# Patient Record
Sex: Male | Born: 1980 | Race: Asian | Hispanic: No | Marital: Married | Smoking: Never smoker
Health system: Southern US, Community
[De-identification: ages and names within clinical notes are randomized; demographics above are authoritative.]

---

## 2012-12-14 ENCOUNTER — Ambulatory Visit: Payer: No Typology Code available for payment source | Attending: Family Medicine | Admitting: Family Medicine

## 2012-12-14 VITALS — BP 128/90 | HR 60 | Temp 98.1°F | Resp 15 | Wt 151.0 lb

## 2012-12-14 DIAGNOSIS — R109 Unspecified abdominal pain: Secondary | ICD-10-CM

## 2012-12-14 DIAGNOSIS — R1013 Epigastric pain: Secondary | ICD-10-CM | POA: Insufficient documentation

## 2012-12-14 LAB — COMPREHENSIVE METABOLIC PANEL
AST: 28 U/L (ref 0–37)
Albumin: 4.8 g/dL (ref 3.5–5.2)
Alkaline Phosphatase: 73 U/L (ref 39–117)
BUN: 16 mg/dL (ref 6–23)
Glucose, Bld: 99 mg/dL (ref 70–99)
Potassium: 3.8 mEq/L (ref 3.5–5.3)
Sodium: 137 mEq/L (ref 135–145)
Total Bilirubin: 0.5 mg/dL (ref 0.3–1.2)
Total Protein: 7.8 g/dL (ref 6.0–8.3)

## 2012-12-14 LAB — CBC WITH DIFFERENTIAL/PLATELET
Basophils Relative: 0 % (ref 0–1)
Eosinophils Absolute: 0.7 10*3/uL (ref 0.0–0.7)
Eosinophils Relative: 11 % — ABNORMAL HIGH (ref 0–5)
Hemoglobin: 14.2 g/dL (ref 13.0–17.0)
Lymphs Abs: 1.9 10*3/uL (ref 0.7–4.0)
MCH: 27.8 pg (ref 26.0–34.0)
MCHC: 34.1 g/dL (ref 30.0–36.0)
MCV: 81.6 fL (ref 78.0–100.0)
Monocytes Relative: 10 % (ref 3–12)
Neutrophils Relative %: 47 % (ref 43–77)
RBC: 5.1 MIL/uL (ref 4.22–5.81)

## 2012-12-14 NOTE — Progress Notes (Signed)
Patient here for abd pain- left side States he has had this pain for over a year

## 2012-12-14 NOTE — Progress Notes (Signed)
Subjective:     Patient ID: Bence Trapp, male   DOB: [birth year], 32 y.o.   MRN: [id number]  HPI Pt here with left sided and epigastric epigastric pain for 8 mos. Pain is worse when he eats spicy foods. It waxes and wanes. 8/10 severity.    Review of Systems no nausea/vomiting/diarrhea/constipation/shob     Objective:   Physical Exam  Nursing note and vitals reviewed. Constitutional: He appears well-developed and well-nourished.  Cardiovascular: Normal rate and regular rhythm.   Pulmonary/Chest: Effort normal and breath sounds normal.  Abdominal: Soft. Bowel sounds are normal. He exhibits no distension. There is no rebound and no guarding.   ttp LUQ and epigastrum      Assessment:     Abdominal  pain, other specified site - Plan: Comprehensive metabolic panel, Urinalysis Dipstick, CBC with Differential, TSH, Lipase, Mononucleosis Test, Qual W/ Reflex, H. pylori Screen       Plan:     Check basic labs as above If nondiagnostic will get CT and refer for egd F/u 2 weeks rtc earlier if needed, ed if acutely worse

## 2012-12-14 NOTE — Patient Instructions (Signed)

## 2012-12-22 ENCOUNTER — Telehealth: Payer: Self-pay | Admitting: *Deleted

## 2012-12-22 ENCOUNTER — Telehealth: Payer: Self-pay

## 2012-12-22 NOTE — Progress Notes (Signed)
Quick Note:  Please inform patient that mono test came back negative.  Rodney Langton, MD, CDE, FAAFP [facility] [place], [place]   ______

## 2012-12-22 NOTE — Telephone Encounter (Signed)
12/22/12 Patient unavailable message left that mono test was negative.P.Lacrecia Delval,RNBSN

## 2012-12-22 NOTE — Telephone Encounter (Signed)
Patient not available left message to return call

## 2014-03-14 ENCOUNTER — Emergency Department (HOSPITAL_COMMUNITY)
Admission: EM | Admit: 2014-03-14 | Discharge: 2014-03-14 | Disposition: A | Discharge status: Home / Self Care | Payer: Self-pay | Attending: Emergency Medicine | Admitting: Emergency Medicine

## 2014-03-14 ENCOUNTER — Emergency Department (HOSPITAL_COMMUNITY): Payer: Self-pay

## 2014-03-14 ENCOUNTER — Encounter (HOSPITAL_COMMUNITY): Payer: Self-pay | Admitting: Emergency Medicine

## 2014-03-14 DIAGNOSIS — S59909A Unspecified injury of unspecified elbow, initial encounter: Secondary | ICD-10-CM | POA: Insufficient documentation

## 2014-03-14 DIAGNOSIS — Y92410 Unspecified street and highway as the place of occurrence of the external cause: Secondary | ICD-10-CM | POA: Insufficient documentation

## 2014-03-14 DIAGNOSIS — M25532 Pain in left wrist: Secondary | ICD-10-CM

## 2014-03-14 DIAGNOSIS — S59919A Unspecified injury of unspecified forearm, initial encounter: Principal | ICD-10-CM

## 2014-03-14 DIAGNOSIS — S6990XA Unspecified injury of unspecified wrist, hand and finger(s), initial encounter: Secondary | ICD-10-CM | POA: Insufficient documentation

## 2014-03-14 DIAGNOSIS — Y9366 Activity, soccer: Secondary | ICD-10-CM | POA: Insufficient documentation

## 2014-03-14 DIAGNOSIS — W219XXA Striking against or struck by unspecified sports equipment, initial encounter: Secondary | ICD-10-CM | POA: Insufficient documentation

## 2014-03-14 MED ORDER — NAPROXEN 500 MG PO TABS: 500.0000 mg | ORAL_TABLET | Freq: Two times a day (BID) | ORAL | Status: AC

## 2014-03-14 MED ORDER — OXYCODONE-ACETAMINOPHEN 5-325 MG PO TABS: 1.0000 | ORAL_TABLET | ORAL | Status: AC | PRN

## 2014-03-14 NOTE — ED Provider Notes (Signed)
CSN: [id number]     Arrival date & time 03/14/14  1921 History   First MD Initiated Contact with Patient 03/14/14 2139     Chief Complaint  Patient presents with  . Hand Pain     (Consider location/radiation/quality/duration/timing/severity/associated sxs/prior Treatment) HPI Chase Willis is a 33 y.o. male who presents for evaluation for left hand pain. History is obtained via interpreter. Patient was playing soccer this afternoon when the ball hit his left hand and bent it backwards. He went to the store and purchased a brace for his wrist as well as a cream that "gets really hot and then cold", but has not helped. He denies taking any other pain medication. He says the pain is mostly on top of his left wrist. No fevers, nausea vomiting, chest pain or difficulty breathing  History reviewed. No pertinent past medical history. No past surgical history on file. No family history on file. History  Substance Use Topics  . Smoking status: Not on file  . Smokeless tobacco: Not on file  . Alcohol Use: Not on file    Review of Systems  Constitutional: Negative for fever.  Respiratory: Negative for shortness of breath.   Cardiovascular: Negative for chest pain.  Musculoskeletal: Positive for arthralgias.  Skin: Negative for rash.      Allergies  Review of patient's allergies indicates no known allergies.  Home Medications   Prior to Admission medications   Medication Sig Start Date End Date Taking? Authorizing Provider  naproxen (NAPROSYN) 500 MG tablet Take 1 tablet (500 mg total) by mouth 2 (two) times daily. 03/14/14   Sharlene Motts, PA-C  oxyCODONE-acetaminophen (PERCOCET) 5-325 MG per tablet Take 1 tablet by mouth every 4 (four) hours as needed. 03/14/14   Earle Gell Girtha Kilgore, PA-C   BP 120/80  Pulse 80  Temp(Src) 98.9 F (37.2 C) (Oral)  Resp 16  Ht  (1.676 m)  Wt 150 lb (68.04 kg)  BMI 24.22 kg/m2  SpO2 98% Physical Exam  Nursing note and vitals  reviewed. Constitutional: He appears well-developed and well-nourished.  HENT:  Head: Normocephalic and atraumatic.  Eyes: Conjunctivae and EOM are normal. Right eye exhibits no discharge. Left eye exhibits no discharge. No scleral icterus.  Cardiovascular:  Peripheral pulses intact at injured extremity.  Pulmonary/Chest: Effort normal. No respiratory distress.  Musculoskeletal:  L wrist Able to flex and extend against resistance. No appreciable edema or erythema present. No obvious deformities noted  Neurological:  No Numbness distal to injury. Neurovascularly intact. Able to perform cardinal hand movements. Cap refill less than 2 seconds. Distal pulses intact. Grip strength intact but decreased due to discomfort. No snuffbox tenderness  Skin: Skin is warm and dry. No rash noted.    ED Course  Procedures (including critical care time) Labs Review Labs Reviewed - No data to display  Imaging Review Dg Hand 2 View Left  03/14/2014   CLINICAL DATA:  Radial wrist pain, soccer injury  EXAM: LEFT HAND - 2 VIEW  COMPARISON:  None.  FINDINGS: There is no evidence of fracture or dislocation. There is no evidence of arthropathy or other focal bone abnormality. Soft tissues are unremarkable.  IMPRESSION: Negative.   Electronically Signed   By: Malachy Moan M.D.   On: 03/14/2014 20:56     EKG Interpretation None      MDM  Vitals stable - WNL -afebrile Pt resting comfortably in ED. PE not concerning for other acute, emergent pathology. Injury sustained consistent with a sprain.  Patient has a splint with him he prefers to use. Imaging shows no acute fractures or dislocations Will DC with naproxen and Percocet for pain Discussed f/u with PCP and return precautions, pt very amenable to plan.   Final diagnoses:  Wrist pain, acute, left  Prior to patient discharge, I discussed and reviewed this case with Dr.Harrison         Sharlene Motts, PA-C 03/14/14 2333

## 2014-03-14 NOTE — ED Notes (Signed)
Pt c/o left hand pain for playing soccer today. Pt's hand was hit by the soccer ball in attempt to stop the ball. CMS intact

## 2014-03-16 NOTE — ED Provider Notes (Signed)
Medical screening examination/treatment/procedure(s) were conducted as a shared visit with non-physician practitioner(s) and myself.  I personally evaluated the patient during the encounter.   EKG Interpretation None      I interviewed and examined the patient. Lungs are CTAB. Cardiac exam wnl. Abdomen soft.  Normal rom of wrist. 2+ pulses in UE's. Likely wrist sprain.   Purvis Sheffield, MD 03/16/14 1213

## 2014-12-23 ENCOUNTER — Encounter (HOSPITAL_COMMUNITY): Payer: Self-pay | Admitting: *Deleted

## 2014-12-23 DIAGNOSIS — R0602 Shortness of breath: Secondary | ICD-10-CM | POA: Insufficient documentation

## 2014-12-23 DIAGNOSIS — K828 Other specified diseases of gallbladder: Secondary | ICD-10-CM | POA: Insufficient documentation

## 2014-12-23 DIAGNOSIS — Z791 Long term (current) use of non-steroidal anti-inflammatories (NSAID): Secondary | ICD-10-CM | POA: Diagnosis not present

## 2014-12-23 DIAGNOSIS — R109 Unspecified abdominal pain: Secondary | ICD-10-CM | POA: Diagnosis present

## 2014-12-23 LAB — COMPREHENSIVE METABOLIC PANEL
ALBUMIN: 4.1 g/dL (ref 3.5–5.0)
ALK PHOS: 94 U/L (ref 38–126)
ALT: 37 U/L (ref 17–63)
ANION GAP: 8 (ref 5–15)
AST: 29 U/L (ref 15–41)
BUN: 18 mg/dL (ref 6–20)
CO2: 29 mmol/L (ref 22–32)
Calcium: 9.2 mg/dL (ref 8.9–10.3)
Chloride: 100 mmol/L — ABNORMAL LOW (ref 101–111)
Creatinine, Ser: 0.99 mg/dL (ref 0.61–1.24)
GFR calc Af Amer: >60 mL/min (ref >60)
GFR calc non Af Amer: >60 mL/min (ref >60)
GLUCOSE: 126 mg/dL — AB (ref 65–99)
POTASSIUM: 3.1 mmol/L — AB (ref 3.5–5.1)
Sodium: 137 mmol/L (ref 135–145)
Total Bilirubin: 0.4 mg/dL (ref 0.3–1.2)
Total Protein: 7.2 g/dL (ref 6.5–8.1)

## 2014-12-23 LAB — CBC WITH DIFFERENTIAL/PLATELET
Basophils Absolute: 0 10*3/uL (ref 0.0–0.1)
Basophils Relative: 0 % (ref 0–1)
Eosinophils Absolute: 0.6 10*3/uL (ref 0.0–0.7)
Eosinophils Relative: 8 % — ABNORMAL HIGH (ref 0–5)
HCT: 40.1 % (ref 39.0–52.0)
Hemoglobin: 13.6 g/dL (ref 13.0–17.0)
LYMPHS ABS: 3.2 10*3/uL (ref 0.7–4.0)
LYMPHS PCT: 42 % (ref 12–46)
MCH: 28.3 pg (ref 26.0–34.0)
MCHC: 33.9 g/dL (ref 30.0–36.0)
MCV: 83.4 fL (ref 78.0–100.0)
Monocytes Absolute: 0.7 10*3/uL (ref 0.1–1.0)
Monocytes Relative: 9 % (ref 3–12)
NEUTROS ABS: 3.1 10*3/uL (ref 1.7–7.7)
Neutrophils Relative %: 41 % — ABNORMAL LOW (ref 43–77)
PLATELETS: 234 10*3/uL (ref 150–400)
RBC: 4.81 MIL/uL (ref 4.22–5.81)
RDW: 13.2 % (ref 11.5–15.5)
WBC: 7.6 10*3/uL (ref 4.0–10.5)

## 2014-12-23 LAB — URINALYSIS, ROUTINE W REFLEX MICROSCOPIC
Bilirubin Urine: NEGATIVE
Glucose, UA: NEGATIVE mg/dL
Hgb urine dipstick: NEGATIVE
Ketones, ur: NEGATIVE mg/dL
LEUKOCYTES UA: NEGATIVE
NITRITE: NEGATIVE
PROTEIN: NEGATIVE mg/dL
Specific Gravity, Urine: 1.021 (ref 1.005–1.030)
Urobilinogen, UA: 0.2 mg/dL (ref 0.0–1.0)
pH: 7 (ref 5.0–8.0)

## 2014-12-23 LAB — LIPASE, BLOOD: Lipase: 20 U/L — ABNORMAL LOW (ref 22–51)

## 2014-12-23 NOTE — ED Notes (Addendum)
Pt c/o abdominal pain. Pt recently moved and changed doctors. Pt's brother states the pt has had abdominal pain for over a year. Pt requesting XR on abdomen. Pt denies n/v, reports some diarrhea

## 2014-12-24 ENCOUNTER — Emergency Department (HOSPITAL_COMMUNITY): Payer: BLUE CROSS/BLUE SHIELD

## 2014-12-24 ENCOUNTER — Emergency Department (HOSPITAL_COMMUNITY)
Admission: EM | Admit: 2014-12-24 | Discharge: 2014-12-24 | Disposition: A | Discharge status: Home / Self Care | Payer: BLUE CROSS/BLUE SHIELD | Attending: Emergency Medicine | Admitting: Emergency Medicine

## 2014-12-24 DIAGNOSIS — K828 Other specified diseases of gallbladder: Secondary | ICD-10-CM

## 2014-12-24 MED ORDER — HYDROCODONE-ACETAMINOPHEN 5-325 MG PO TABS: 1.0000 | ORAL_TABLET | ORAL | Status: AC | PRN

## 2014-12-24 MED ORDER — ONDANSETRON 8 MG PO TBDP: 8.0000 mg | ORAL_TABLET | Freq: Three times a day (TID) | ORAL | Status: AC | PRN

## 2014-12-24 NOTE — Discharge Instructions (Signed)
Biliary Colic  °Biliary colic is a steady or irregular pain in the upper abdomen. It is usually under the right side of the rib cage. It happens when gallstones interfere with the normal flow of bile from the gallbladder. Bile is a liquid that helps to digest fats. Bile is made in the liver and stored in the gallbladder. When you eat a meal, bile passes from the gallbladder through the cystic duct and the common bile duct into the small intestine. There, it mixes with partially digested food. If a gallstone blocks either of these ducts, the normal flow of bile is blocked. The muscle cells in the bile duct contract forcefully to try to move the stone. This causes the pain of biliary colic.  °SYMPTOMS  °· A person with biliary colic usually complains of pain in the upper abdomen. This pain can be: °¨ In the center of the upper abdomen just below the breastbone. °¨ In the upper-right part of the abdomen, near the gallbladder and liver. °¨ Spread back toward the right shoulder blade. °· Nausea and vomiting. °· The pain usually occurs after eating. °· Biliary colic is usually triggered by the digestive system's demand for bile. The demand for bile is high after fatty meals. Symptoms can also occur when a person who has been fasting suddenly eats a very large meal. Most episodes of biliary colic pass after 1 to 5 hours. After the most intense pain passes, your abdomen may continue to ache mildly for about 24 hours. °DIAGNOSIS  °After you describe your symptoms, your caregiver will perform a physical exam. He or she will pay attention to the upper right portion of your belly (abdomen). This is the area of your liver and gallbladder. An ultrasound will help your caregiver look for gallstones. Specialized scans of the gallbladder may also be done. Blood tests may be done, especially if you have fever or if your pain persists. °PREVENTION  °Biliary colic can be prevented by controlling the risk factors for gallstones. Some of  these risk factors, such as heredity, increasing age, and pregnancy are a normal part of life. Obesity and a high-fat diet are risk factors you can change through a healthy lifestyle. Women going through menopause who take hormone replacement therapy (estrogen) are also more likely to develop biliary colic. °TREATMENT  °· Pain medication may be prescribed. °· You may be encouraged to eat a fat-free diet. °· If the first episode of biliary colic is severe, or episodes of colic keep retuning, surgery to remove the gallbladder (cholecystectomy) is usually recommended. This procedure can be done through small incisions using an instrument called a laparoscope. The procedure often requires a brief stay in the hospital. Some people can leave the hospital the same day. It is the most widely used treatment in people troubled by painful gallstones. It is effective and safe, with no complications in more than 90% of cases. °· If surgery cannot be done, medication that dissolves gallstones may be used. This medication is expensive and can take months or years to work. Only small stones will dissolve. °· Rarely, medication to dissolve gallstones is combined with a procedure called shock-wave lithotripsy. This procedure uses carefully aimed shock waves to break up gallstones. In many people treated with this procedure, gallstones form again within a few years. °PROGNOSIS  °If gallstones block your cystic duct or common bile duct, you are at risk for repeated episodes of biliary colic. There is also a 25% chance that you will develop   a gallbladder infection(acute cholecystitis), or some other complication of gallstones within 10 to 20 years. If you have surgery, schedule it at a time that is convenient for you and at a time when you are not sick. °HOME CARE INSTRUCTIONS  °· Drink plenty of clear fluids. °· Avoid fatty, greasy or fried foods, or any foods that make your pain worse. °· Take medications as directed. °SEEK MEDICAL  CARE IF:  °· You develop a fever over 100.5° F (38.1° C). °· Your pain gets worse over time. °· You develop nausea that prevents you from eating and drinking. °· You develop vomiting. °SEEK IMMEDIATE MEDICAL CARE IF:  °· You have continuous or severe belly (abdominal) pain which is not relieved with medications. °· You develop nausea and vomiting which is not relieved with medications. °· You have symptoms of biliary colic and you suddenly develop a fever and shaking chills. This may signal cholecystitis. Call your caregiver immediately. °· You develop a yellow color to your skin or the white part of your eyes (jaundice). °Document Released: 11/18/2005 Document Revised: 09/09/2011 Document Reviewed: 01/28/2008 °ExitCare® Patient Information ©2015 [employer]. This information is not intended to replace advice given to you by your health care provider. Make sure you discuss any questions you have with your health care provider. ° °

## 2014-12-24 NOTE — ED Provider Notes (Signed)
CSN: [id number]     Arrival date & time 12/23/14  2222 History  This chart was scribed for Azalia Bilis, MD by Merlene Laughter, ED Scribe. This patient was seen in room B15C/B15C and the patient's care was started at 2:40 AM.    Chief Complaint  Patient presents with  . Abdominal Pain     The history is provided by the patient. No language interpreter was used.    HPI Comments: Chase Willis is a 34 y.o. male who presents to the Emergency Department complaining of sudden onset, severe abdominal pain, each episode lasting 5 minutes that began 2 days ago with associated onset of shortness of breath due to increased pain.  Pain is exacerbated after eating a meal.  Patient complains of associated episode of diarrhea that occured 2 days ago that has since resolved. Patient reports history of gastritis 1 year ago. He denies associated nausea,vomiting, fever, hematochezia, melena, and dysuria.         History reviewed. No pertinent past medical history. History reviewed. No pertinent past surgical history. History reviewed. No pertinent family history. History  Substance Use Topics  . Smoking status: Never Smoker   . Smokeless tobacco: Never Used  . Alcohol Use: Yes    Review of Systems  A complete 10 system review of systems was obtained and all systems are negative except as noted in the HPI and PMH.    Allergies  Review of patient's allergies indicates no known allergies.  Home Medications   Prior to Admission medications   Medication Sig Start Date End Date Taking? Authorizing Provider  naproxen (NAPROSYN) 500 MG tablet Take 1 tablet (500 mg total) by mouth 2 (two) times daily. 03/14/14   Joycie Peek, PA-C  oxyCODONE-acetaminophen (PERCOCET) 5-325 MG per tablet Take 1 tablet by mouth every 4 (four) hours as needed. 03/14/14   Joycie Peek, PA-C   Triage Vitals: BP 111/64 mmHg  Pulse 59  Temp(Src) 98 F (36.7 C) (Oral)  Resp 16  SpO2 100% Physical Exam  Constitutional: He  is oriented to person, place, and time. He appears well-developed and well-nourished.  HENT:  Head: Normocephalic and atraumatic.  Eyes: EOM are normal.  Neck: Normal range of motion.  Cardiovascular: Normal rate, regular rhythm, normal heart sounds and intact distal pulses.   Pulmonary/Chest: Effort normal and breath sounds normal. No respiratory distress.  Abdominal: Soft. He exhibits no distension. There is tenderness.  Mild epigastric tenderness  Musculoskeletal: Normal range of motion.  Neurological: He is alert and oriented to person, place, and time.  Skin: Skin is warm and dry.  Psychiatric: He has a normal mood and affect. Judgment normal.  Nursing note and vitals reviewed.   ED Course  Procedures  DIAGNOSTIC STUDIES: Oxygen Saturation is 100% on room air, normal by my interpretation.    COORDINATION OF CARE: 2:49 AM- Discussed plans to order diagnostic ultrasound, lab work and urnialysis.  Pt advised of plan for treatment and pt agrees.  Labs Review Labs Reviewed  CBC WITH DIFFERENTIAL/PLATELET - Abnormal; Notable for the following:    Neutrophils Relative % 41 (*)    Eosinophils Relative 8 (*)    All other components within normal limits  COMPREHENSIVE METABOLIC PANEL - Abnormal; Notable for the following:    Potassium 3.1 (*)    Chloride 100 (*)    Glucose, Bld 126 (*)    All other components within normal limits  LIPASE, BLOOD - Abnormal; Notable for the following:    Lipase  20 (*)    All other components within normal limits  URINALYSIS, ROUTINE W REFLEX MICROSCOPIC (NOT AT [facility])    Imaging Review US Abdomen Complete  12/24/2014   CLINICAL DATA:  Generalized abdominal pain for greater than 1 year.  EXAM: ULTRASOUND ABDOMEN COMPLETE  COMPARISON:  None.  FINDINGS: Gallbladder: Small amount of echogenic apparent sludge within the gallbladder without gallbladder distension, wall thickening or pericholecystic cleared. No sonographic Murphy's sign elicited.  Common  bile duct: Diameter: 3 mm  Liver: Mildly echogenic liver without intrahepatic biliary dilatation. Hepatopetal portal vein.  IVC: No abnormality visualized.  Pancreas: Visualized portions are normal though, predominately obscured by bowel gas.  Spleen: Size and appearance within normal limits.  Right Kidney: Length: 10 cm. Echogenicity within normal limits. No mass or hydronephrosis visualized.  Left Kidney: Length: 10.9 cm. Echogenicity within normal limits. No mass or hydronephrosis visualized.  Abdominal aorta: No aneurysm visualized.  Other findings: None.  IMPRESSION: Small amount of suspected gallbladder sludge without sonographic findings of acute cholecystitis.  Mild hepatic steatosis.   Electronically Signed   By: Awilda Metro M.D.   On: 12/24/2014 04:41  I personally reviewed the imaging tests through PACS system I reviewed available ER/hospitalization records through the EMR    EKG Interpretation None      MDM   Final diagnoses:  Gallbladder sludge    Pt with symptoms consistent with biliary colic.  Ultrasound demonstrates gallbladder sludge.  Outpatient general surgery follow-up.  No discomfort or pain at this time.  No signs of cholecystitis.  LFTs and lipase are normal.  Patient understands to return to the ER for new or worsening symptoms   I personally performed the services described in this documentation, which was scribed in my presence. The recorded information has been reviewed and is accurate.       Azalia Bilis, MD 12/24/14 [phone]

## 2014-12-24 NOTE — ED Notes (Signed)
Pt speaks Karen 

## 2015-01-11 ENCOUNTER — Other Ambulatory Visit: Payer: Self-pay | Admitting: General Surgery

## 2015-01-11 ENCOUNTER — Ambulatory Visit: Payer: Self-pay | Admitting: General Surgery

## 2015-01-11 DIAGNOSIS — R1011 Right upper quadrant pain: Secondary | ICD-10-CM

## 2015-01-25 ENCOUNTER — Ambulatory Visit (HOSPITAL_COMMUNITY): Admission: RE | Admit: 2015-01-25 | Payer: BLUE CROSS/BLUE SHIELD | Source: Ambulatory Visit

## 2015-04-10 ENCOUNTER — Ambulatory Visit (HOSPITAL_COMMUNITY)
Admission: RE | Admit: 2015-04-10 | Discharge: 2015-04-10 | Disposition: A | Discharge status: Home / Self Care | Payer: BLUE CROSS/BLUE SHIELD | Source: Ambulatory Visit | Attending: General Surgery | Admitting: General Surgery

## 2015-04-10 DIAGNOSIS — R1011 Right upper quadrant pain: Secondary | ICD-10-CM

## 2015-04-10 DIAGNOSIS — R109 Unspecified abdominal pain: Secondary | ICD-10-CM | POA: Diagnosis present

## 2015-04-10 MED ORDER — TECHNETIUM TC 99M MEBROFENIN IV KIT
5.4000 | PACK | Freq: Once | INTRAVENOUS | Status: DC | PRN
  Administered 2015-04-10: 5 via INTRAVENOUS
  Filled 2015-04-10: qty 6

## 2015-04-10 MED ORDER — SINCALIDE 5 MCG IJ SOLR
0.0200 ug/kg | Freq: Once | INTRAMUSCULAR | Status: AC
  Administered 2015-04-10: 1.4 ug via INTRAVENOUS

## 2016-05-25 IMAGING — US US ABDOMEN COMPLETE
1 series · 14 of 25 positions shown · non-contrast
Comparison: None.

CLINICAL DATA: Generalized abdominal pain for greater than 1 year.

EXAM:
ULTRASOUND ABDOMEN COMPLETE

[Series 1: us abdomen complete · 0.24mm/px · 14 of 82 slices shown]
[im 1/82]
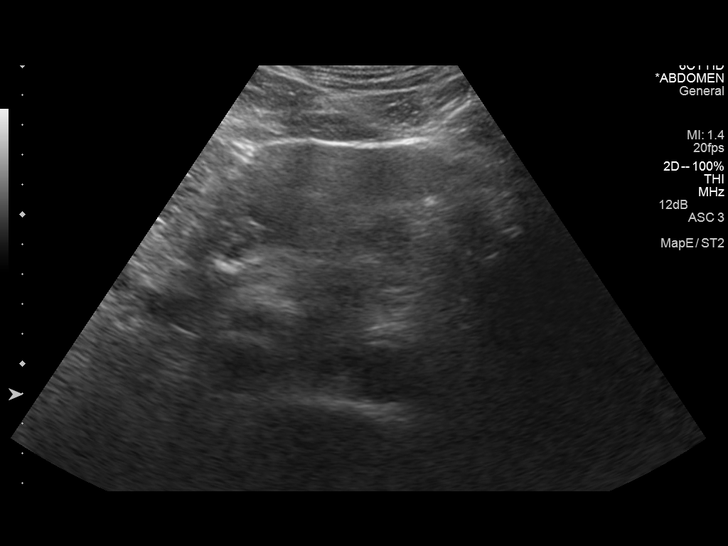
[im 7/82]
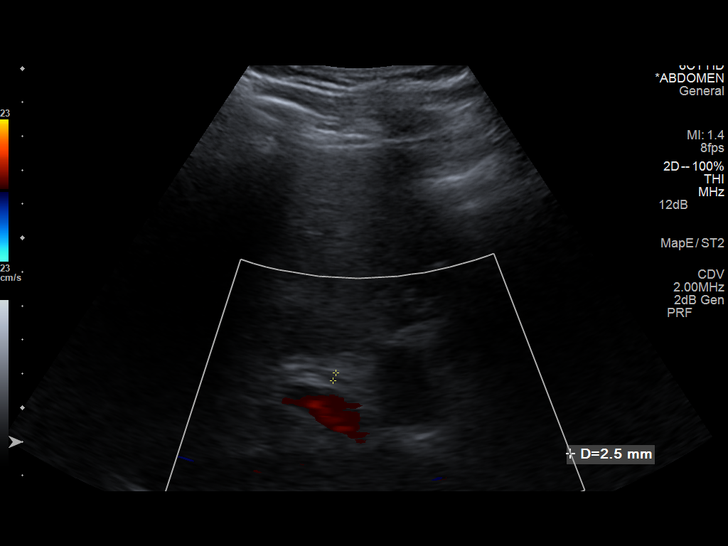
[im 14/82]
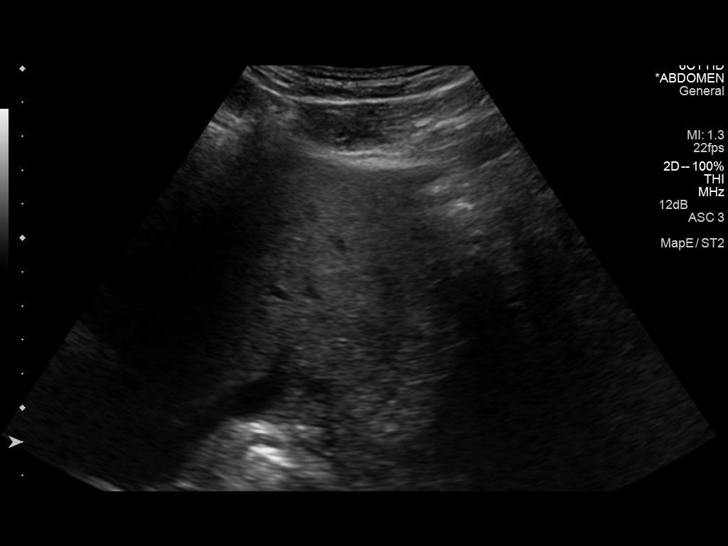
[im 21/82]
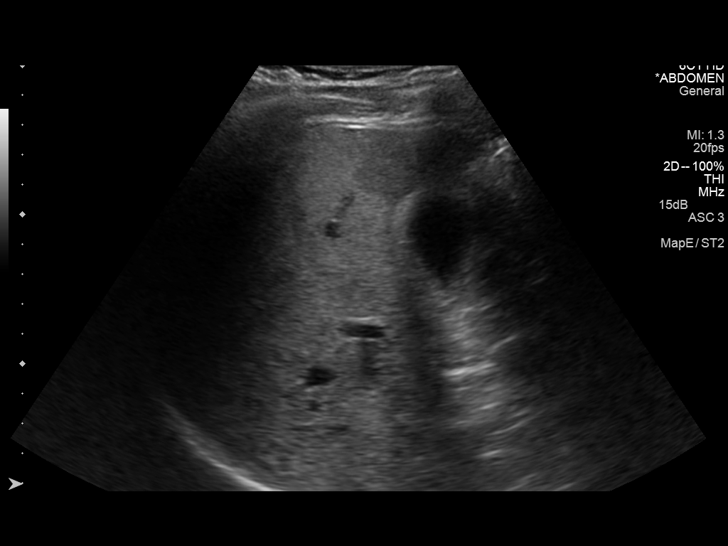
[im 28/82]
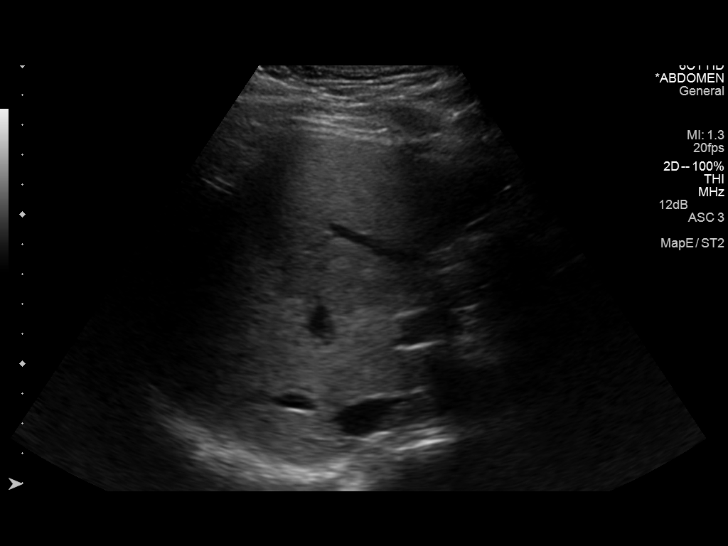
[im 31/82]
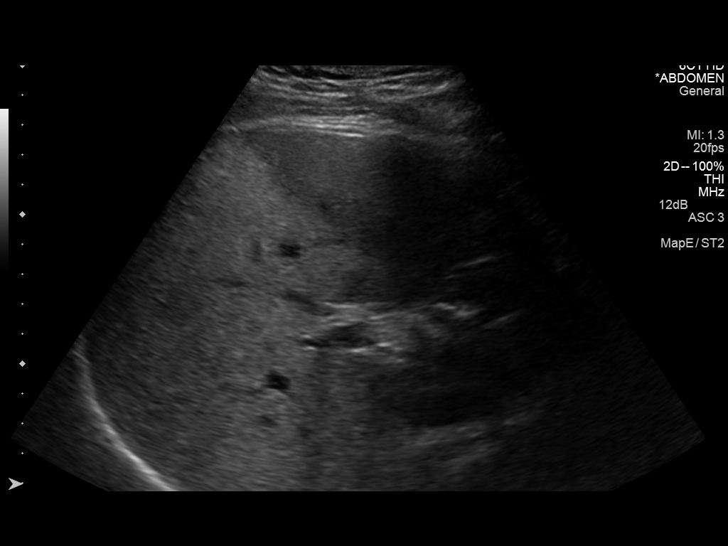
[im 38/82]
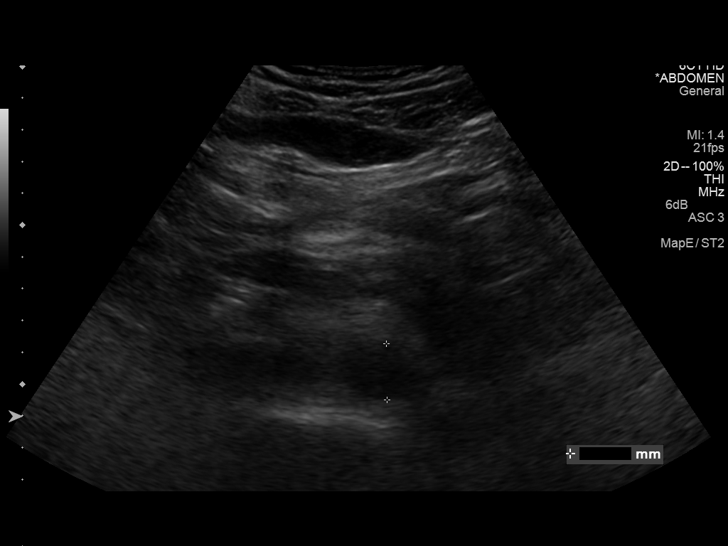
[im 44/82]
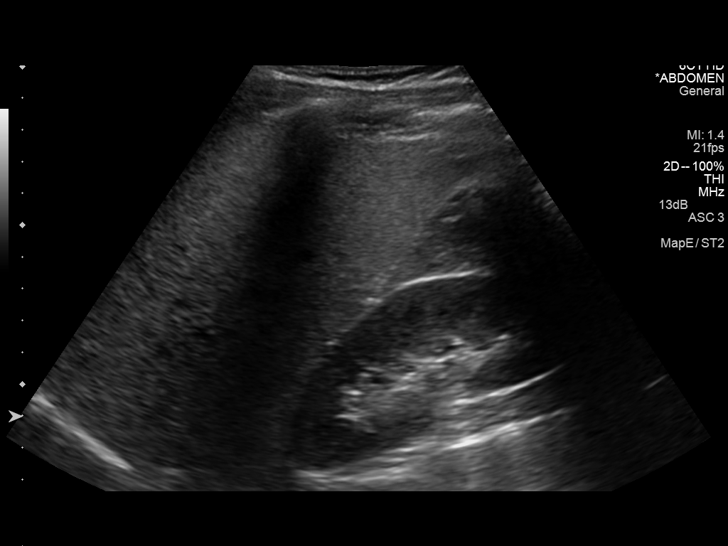
[im 51/82]
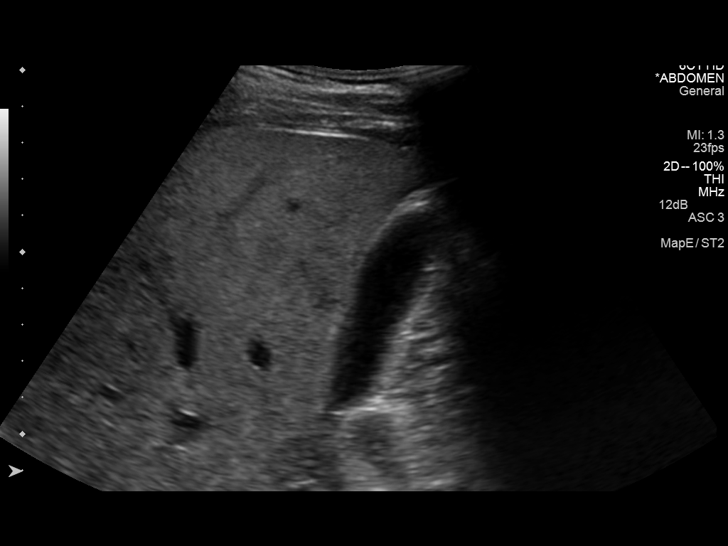
[im 55/82]
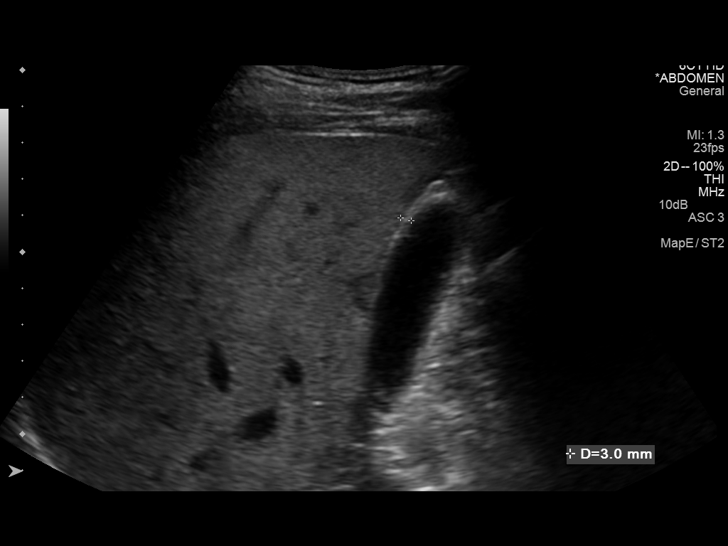
[im 61/82]
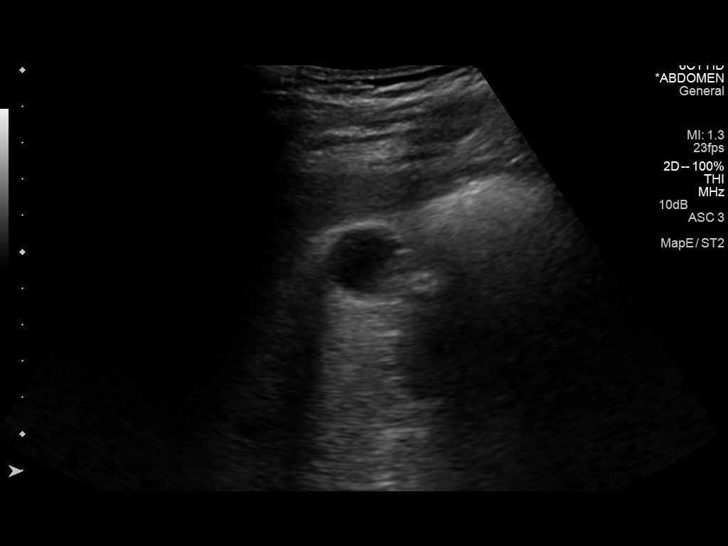
[im 68/82]
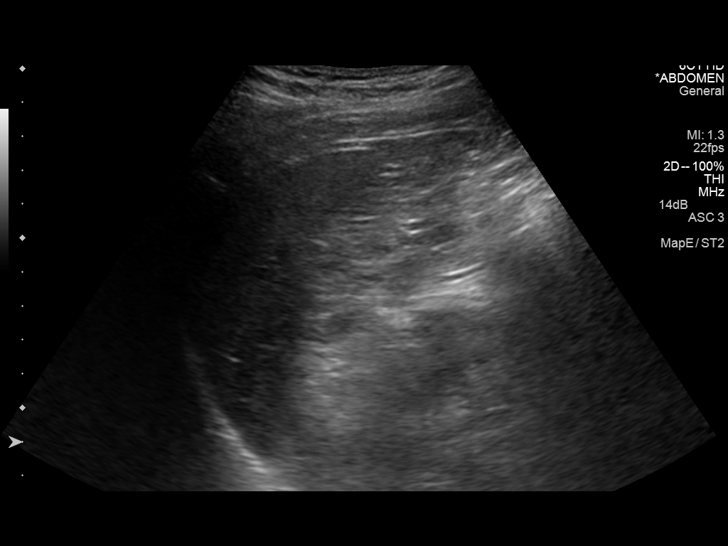
[im 75/82]
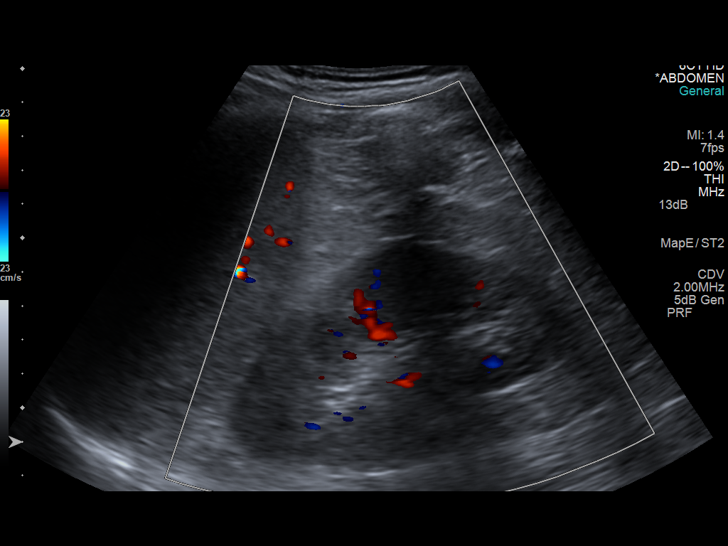
[im 82/82]
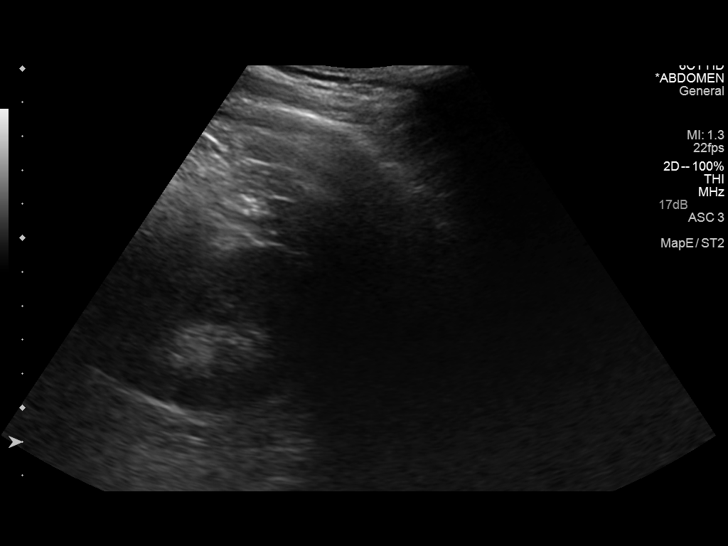

[14 of 25 positions shown; findings below may reference images not displayed]

FINDINGS: Gallbladder: Small amount of echogenic apparent sludge within the
gallbladder without gallbladder distension, wall thickening or
pericholecystic cleared. No sonographic Murphy's sign elicited.

Common bile duct: Diameter: 3 mm

Liver: Mildly echogenic liver without intrahepatic biliary
dilatation. Hepatopetal portal vein.

IVC: No abnormality visualized.

Pancreas: Visualized portions are normal though, predominately
obscured by bowel gas.

Spleen: Size and appearance within normal limits.

Right Kidney: Length: 10 cm. Echogenicity within normal limits. No
mass or hydronephrosis visualized.

Left Kidney: Length: 10.9 cm. Echogenicity within normal limits. No
mass or hydronephrosis visualized.

Abdominal aorta: No aneurysm visualized.

Other findings: None.
IMPRESSION: Small amount of suspected gallbladder sludge without sonographic
findings of acute cholecystitis.

Mild hepatic steatosis.

## 2019-11-20 ENCOUNTER — Ambulatory Visit: Payer: Self-pay | Attending: Internal Medicine

## 2019-11-20 DIAGNOSIS — Z23 Encounter for immunization: Secondary | ICD-10-CM

## 2019-11-20 NOTE — Progress Notes (Signed)
   Covid-19 Vaccination Clinic  Name:  Chase Willis    MRN: [id number] DOB: 28-Dec-1980  11/20/2019  [name] was observed post Covid-19 immunization for 15 minutes without incident. He was provided with Vaccine Information Sheet and instruction to access the V-Safe system.   [name] was instructed to call 911 with any severe reactions post vaccine: Marland Kitchen Difficulty breathing  . Swelling of face and throat  . A fast heartbeat  . A bad rash all over body  . Dizziness and weakness   Immunizations Administered    Name Date Dose VIS Date Route   Pfizer COVID-19 Vaccine 11/20/2019  9:47 AM 0.3 mL 08/25/2018 Intramuscular   Manufacturer: [employer], [employer]   Lot: [id number]   NDC: [address]-4

## 2019-12-13 ENCOUNTER — Ambulatory Visit: Payer: Self-pay | Attending: Internal Medicine

## 2019-12-13 DIAGNOSIS — Z23 Encounter for immunization: Secondary | ICD-10-CM

## 2019-12-13 NOTE — Progress Notes (Signed)
   Covid-19 Vaccination Clinic  Name:  Chase Willis    MRN: [id number] DOB: 07/04/1980  12/13/2019  Chase Willis was observed post Covid-19 immunization for 15 minutes without incident. He was provided with Vaccine Information Sheet and instruction to access the V-Safe system.   Chase Willis was instructed to call 911 with any severe reactions post vaccine: Marland Kitchen Difficulty breathing  . Swelling of face and throat  . A fast heartbeat  . A bad rash all over body  . Dizziness and weakness   Immunizations Administered    Name Date Dose VIS Date Route   Pfizer COVID-19 Vaccine 12/13/2019  2:03 PM 0.3 mL 08/25/2018 Intramuscular   Manufacturer: [employer], [employer]   Lot: [id number]   NDC: [address]-9

## 2022-09-19 ENCOUNTER — Other Ambulatory Visit: Payer: Self-pay

## 2022-09-19 ENCOUNTER — Ambulatory Visit (HOSPITAL_COMMUNITY)
Admission: RE | Admit: 2022-09-19 | Discharge: 2022-09-19 | Disposition: A | Discharge status: Home / Self Care | Payer: BC Managed Care – PPO | Source: Ambulatory Visit | Attending: Sports Medicine | Admitting: Sports Medicine

## 2022-09-19 ENCOUNTER — Encounter (HOSPITAL_COMMUNITY): Payer: Self-pay

## 2022-09-19 VITALS — BP 115/80 | HR 62 | Temp 97.9°F | Resp 18

## 2022-09-19 DIAGNOSIS — M5431 Sciatica, right side: Secondary | ICD-10-CM

## 2022-09-19 MED ORDER — METHYLPREDNISOLONE SODIUM SUCC 125 MG IJ SOLR
INTRAMUSCULAR | Status: AC
  Filled 2022-09-19: qty 2

## 2022-09-19 MED ORDER — KETOROLAC TROMETHAMINE 30 MG/ML IJ SOLN
30.0000 mg | Freq: Once | INTRAMUSCULAR | Status: AC
  Administered 2022-09-19: 30 mg via INTRAMUSCULAR

## 2022-09-19 MED ORDER — KETOROLAC TROMETHAMINE 30 MG/ML IJ SOLN
INTRAMUSCULAR | Status: AC
  Filled 2022-09-19: qty 1

## 2022-09-19 MED ORDER — METHYLPREDNISOLONE SODIUM SUCC 125 MG IJ SOLR
60.0000 mg | Freq: Once | INTRAMUSCULAR | Status: AC
  Administered 2022-09-19: 60 mg via INTRAMUSCULAR

## 2022-09-19 NOTE — ED Triage Notes (Signed)
Pain in right lower back and right leg.  Right leg pain is worse than back pain.  Marland Kitchen    Has not had leg pain before, it is usually only low back pain.  No known injury.  Has been lifting heavy things recently.    Patient is taking ibuprofen-no relief

## 2022-09-19 NOTE — Discharge Instructions (Addendum)
For your back pain today you received 2 injections both for pain and inflammation.  This should help your symptoms.  Do not take any ibuprofen products for the rest of today.  Begin the stretches and exercises I have provided after you begin to feel a little better in a few days.  I have also given you sports medicine on-call providers information for follow-up if her symptoms worsen or fail to improve.

## 2022-09-19 NOTE — ED Provider Notes (Signed)
[facility]    CSN: [id number] Arrival date & time: 09/19/22  1108      History   Chief Complaint Chief Complaint  Patient presents with   Leg Pain   Appointment    11:00    HPI Chase Willis is a 42 y.o. male.   He is here today with chief complaint of low back pain rating down his right leg.  He is here with his daughter who is translating for him.  He reports his pain began earlier this week after lifting and squatting frequently.  He has tried ibuprofen and topical patches with no relief of his pain.  He denies any loss of bowel or bladder control or numbness and tingling down his leg but reports the pain shoots down all the way into his foot.  He denies any symptoms like this in the past.  He has tried ibuprofen with only very temporary relief, he took 800 mg of ibuprofen this morning.  This pain is keeping him from sleeping, sitting and walking appropriately.   Leg Pain   History reviewed. No pertinent past medical history.  There are no problems to display for this patient.   History reviewed. No pertinent surgical history.     Home Medications    Prior to Admission medications   Medication Sig Start Date End Date Taking? Authorizing Provider  HYDROcodone-acetaminophen (NORCO/VICODIN) 5-325 MG per tablet Take 1 tablet by mouth every 4 (four) hours as needed for moderate pain. Patient not taking: Reported on 09/19/2022 12/24/14   Jola Schmidt, MD  naproxen (NAPROSYN) 500 MG tablet Take 1 tablet (500 mg total) by mouth 2 (two) times daily. Patient not taking: Reported on 09/19/2022 03/14/14   Comer Locket, PA-C  ondansetron (ZOFRAN ODT) 8 MG disintegrating tablet Take 1 tablet (8 mg total) by mouth every 8 (eight) hours as needed for nausea or vomiting. Patient not taking: Reported on 09/19/2022 12/24/14   Jola Schmidt, MD  oxyCODONE-acetaminophen (PERCOCET) 5-325 MG per tablet Take 1 tablet by mouth every 4 (four) hours as needed. Patient not taking:  Reported on 09/19/2022 03/14/14   Comer Locket, PA-C    Family History History reviewed. No pertinent family history.  Social History Social History   Tobacco Use   Smoking status: Never   Smokeless tobacco: Never  Vaping Use   Vaping Use: Never used  Substance Use Topics   Alcohol use: Not Currently   Drug use: No     Allergies   Patient has no known allergies.   Review of Systems Review of Systems as listed above in HPI   Physical Exam Triage Vital Signs ED Triage Vitals  Enc Vitals Group     BP 09/19/22 1145 115/80     Pulse Rate 09/19/22 1145 62     Resp 09/19/22 1145 18     Temp 09/19/22 1145 97.9 F (36.6 C)     Temp Source 09/19/22 1145 Oral     SpO2 09/19/22 1145 97 %     Weight --      Height --      Head Circumference --      Peak Flow --      Pain Score 09/19/22 1141 10     Pain Loc --      Pain Edu? --      Excl. in [place]? --    No data found.  Updated Vital Signs BP 115/80 (BP Location: Right Arm)   Pulse 62   Temp  97.9 F (36.6 C) (Oral)   Resp 18   SpO2 97%   Physical Exam Vitals reviewed.  Constitutional:      General: He is not in acute distress.    Appearance: Normal appearance. He is normal weight. He is not ill-appearing, toxic-appearing or diaphoretic.     Comments: He appears uncomfortable switching positions frequently during my exam  HENT:     Head: Normocephalic.  Cardiovascular:     Rate and Rhythm: Normal rate.  Pulmonary:     Effort: Pulmonary effort is normal.  Musculoskeletal:     Comments: Lumbar spine: No obvious deformity or asymmetry.  No tenderness to palpation of the midline lumbar spine.  Some tenderness to palpation of the quadratus lumborum and gluteal musculature.  Antalgic gait.  Patient is unable to sit completely secondary to his pain.  Positive seated straight leg raise on right.  Sensation intact to light touch.  Strength 5/5 at the ankle and knee.  Neurological:     Mental Status: He is alert.       UC Treatments / Results  Labs (all labs ordered are listed, but only abnormal results are displayed) Labs Reviewed - No data to display  EKG   Radiology No results found.  Procedures Procedures (including critical care time)  Medications Ordered in UC Medications  ketorolac (TORADOL) 30 MG/ML injection 30 mg (has no administration in time range)  methylPREDNISolone sodium succinate (SOLU-MEDROL) 125 mg/2 mL injection 60 mg (has no administration in time range)    Initial Impression / Assessment and Plan / UC Course  I have reviewed the triage vital signs and the nursing notes.  Pertinent labs & imaging results that were available during my care of the patient were reviewed by me and considered in my medical decision making (see chart for details).     Sciatica Patient was given 2 intramuscular injections, Toradol 30 mg and methylprednisolone 60 mg today for pain and inflammation.  He opted for shots as opposed to oral medication.  He was also given some gentle stretches and exercises to begin.  I counseled him to follow-up at the urgent care if his symptoms worsen or fail to improve.  I have also given him the information for the on-call sports medicine provider. Final Clinical Impressions(s) / UC Diagnoses   Final diagnoses:  None   Discharge Instructions   None    ED Prescriptions   None    PDMP not reviewed this encounter.   Elmore Guise, DO 09/19/22 1258
